# Patient Record
Sex: Male | Born: 1995
Health system: Southern US, Community
[De-identification: ages and names within clinical notes are randomized; demographics above are authoritative.]

## PROBLEM LIST (undated history)

## (undated) DIAGNOSIS — F909 Attention-deficit hyperactivity disorder, unspecified type: Secondary | ICD-10-CM

---
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
## 2020-12-04 ENCOUNTER — Ambulatory Visit
Admission: EM | Admit: 2020-12-04 | Discharge: 2020-12-04 | Disposition: A | Discharge status: Home / Self Care | Payer: Self-pay

## 2020-12-04 ENCOUNTER — Other Ambulatory Visit: Payer: Self-pay

## 2020-12-04 ENCOUNTER — Ambulatory Visit: Payer: Self-pay

## 2020-12-04 ENCOUNTER — Ambulatory Visit: Admit: 2020-12-04 | Discharge status: Absent without leave | Payer: Self-pay

## 2020-12-04 DIAGNOSIS — H60393 Other infective otitis externa, bilateral: Secondary | ICD-10-CM

## 2020-12-04 HISTORY — DX: Attention-deficit hyperactivity disorder, unspecified type: F90.9

## 2020-12-04 MED ORDER — OFLOXACIN 0.3 % OT SOLN
10.0000 [drp] | Freq: Every day | OTIC | 0 refills | Status: AC
Start: 2020-12-04 — End: ?

## 2020-12-04 MED ORDER — LEVOFLOXACIN 500 MG PO TABS: 500.0000 mg | ORAL_TABLET | Freq: Every day | ORAL | 0 refills | Status: AC

## 2020-12-04 NOTE — ED Triage Notes (Signed)
Pt reports feeling pressure and having a decrease in hearing in left ear for the past two days. Pt also c/o scaling of skin to bilateral ears. Pt states he has been swimming recently. Pt states 800mg  ibuprofen is not effective.

## 2020-12-04 NOTE — ED Provider Notes (Signed)
[facility]    CSN: [id number] Arrival date & time: 12/04/20  0844      History   Chief Complaint Chief Complaint  Patient presents with   Otalgia    HPI Terez Freimark is a 25 y.o. male.  He reports 2 or more months of dry scaling irritated skin on both of his outer ears.  Last 2 days, he has also had feelings of pressure and pain and decreased hearing in the left ear.  He tried irrigating it with hydrogen peroxide and felt like some material came out including some blood but did not solve the problem.  Has not had anything like this in the past.   Otalgia Associated symptoms: no congestion and no fever    Past Medical History:  Diagnosis Date   ADHD     There are no problems to display for this patient.   History reviewed. No pertinent surgical history.     Home Medications    Prior to Admission medications   Medication Sig Start Date End Date Taking? Authorizing Provider  ibuprofen (ADVIL) 800 MG tablet Take 800 mg by mouth every 6 (six) hours as needed. 09/13/20  Yes [provider]  levofloxacin (LEVAQUIN) 500 MG tablet Take 1 tablet (500 mg total) by mouth daily. 12/04/20  Yes Cathlyn Parsons, NP  ofloxacin (FLOXIN) 0.3 % OTIC solution Place 10 drops into both ears daily. 12/04/20  Yes Cathlyn Parsons, NP    Family History History reviewed. No pertinent family history.  Social History Social History   Tobacco Use   Smoking status: Never   Smokeless tobacco: Never  Vaping Use   Vaping Use: Never used  Substance Use Topics   Alcohol use: Not Currently   Drug use: Never     Allergies   Acetaminophen   Review of Systems Review of Systems  Constitutional:  Negative for chills and fever.  HENT:  Positive for ear pain. Negative for congestion.     Physical Exam Triage Vital Signs ED Triage Vitals  Enc Vitals Group     BP 12/04/20 0935 138/87     Pulse Rate 12/04/20 0935 98     Resp 12/04/20 0935 20     Temp 12/04/20 0935 98.3 F  (36.8 C)     Temp Source 12/04/20 0935 Oral     SpO2 12/04/20 0935 97 %     Weight --      Height --      Head Circumference --      Peak Flow --      Pain Score 12/04/20 0931 7     Pain Loc --      Pain Edu? --      Excl. in GC? --    No data found.  Updated Vital Signs BP 138/87 (BP Location: Right Arm)   Pulse 98   Temp 98.3 F (36.8 C) (Oral)   Resp 20   SpO2 97%   Visual Acuity Right Eye Distance:   Left Eye Distance:   Bilateral Distance:    Right Eye Near:   Left Eye Near:    Bilateral Near:     Physical Exam Constitutional:      Appearance: Normal appearance.  HENT:     Right Ear: Drainage, swelling and tenderness present. There is no impacted cerumen. Tympanic membrane is not injected, perforated, erythematous or bulging.     Ears:     Comments: Bilateral ears have scaling skin with excoriations and  areas, left worse than right.  Tenderness with pinna movement and when palpating tragus.  Right ear canal erythematous with white discharge in canal and on TM.  Right TM is intact and does not appear erythematous. Left ear edematous with significant white discharge.  I am not able to visualize TM. Neurological:     Mental Status: He is alert.     UC Treatments / Results  Labs (all labs ordered are listed, but only abnormal results are displayed) Labs Reviewed - No data to display  EKG   Radiology No results found.  Procedures Procedures (including critical care time)  Medications Ordered in UC Medications - No data to display  Initial Impression / Assessment and Plan / UC Course  I have reviewed the triage vital signs and the nursing notes.  Pertinent labs & imaging results that were available during my care of the patient were reviewed by me and considered in my medical decision making (see chart for details).    Patient has bilateral otitis externa.  Given that some symptoms have been going on for over 2 months, I suspect this infection has  been going on for quite a while.  Prescribed ofloxacin otic as I cannot see the left TM to verify is intact.  Also prescribed levofloxacin orally to treat infection in bilateral pinna.  Gave patient name of ENT to follow-up with if symptoms do not resolve.  Recommended patient get his ears checked in a week to make sure the infection has completely resolved.  Final Clinical Impressions(s) / UC Diagnoses   Final diagnoses:  Infective otitis externa of both ears   Discharge Instructions   None    ED Prescriptions     Medication Sig Dispense Auth. Provider   ofloxacin (FLOXIN) 0.3 % OTIC solution Place 10 drops into both ears daily. 5 mL Cathlyn Parsons, NP   levofloxacin (LEVAQUIN) 500 MG tablet Take 1 tablet (500 mg total) by mouth daily. 7 tablet Cathlyn Parsons, NP      PDMP not reviewed this encounter.   Cathlyn Parsons, NP 12/04/20 1214
